# Patient Record
Sex: Male | Born: 1955
Health system: Southern US, Community
[De-identification: ages and names within clinical notes are randomized; demographics above are authoritative.]

## PROBLEM LIST (undated history)

## (undated) DIAGNOSIS — I10 Essential (primary) hypertension: Secondary | ICD-10-CM

## (undated) DIAGNOSIS — E119 Type 2 diabetes mellitus without complications: Secondary | ICD-10-CM

---

## 1998-04-07 ENCOUNTER — Emergency Department (HOSPITAL_COMMUNITY): Admission: EM | Admit: 1998-04-07 | Discharge: 1998-04-08 | Payer: Self-pay | Admitting: Endocrinology

## 2002-08-14 ENCOUNTER — Encounter: Payer: Self-pay | Admitting: Family Medicine

## 2002-08-14 ENCOUNTER — Ambulatory Visit (HOSPITAL_COMMUNITY): Admission: RE | Admit: 2002-08-14 | Discharge: 2002-08-14 | Payer: Self-pay | Admitting: Family Medicine

## 2013-12-23 ENCOUNTER — Other Ambulatory Visit: Payer: Self-pay | Admitting: Nurse Practitioner

## 2013-12-23 DIAGNOSIS — C220 Liver cell carcinoma: Secondary | ICD-10-CM

## 2014-01-19 ENCOUNTER — Ambulatory Visit
Admission: RE | Admit: 2014-01-19 | Discharge: 2014-01-19 | Disposition: A | Discharge status: Home / Self Care | Payer: 59 | Source: Ambulatory Visit | Attending: Nurse Practitioner | Admitting: Nurse Practitioner

## 2014-01-19 DIAGNOSIS — C220 Liver cell carcinoma: Secondary | ICD-10-CM

## 2014-09-14 ENCOUNTER — Other Ambulatory Visit: Payer: Self-pay | Admitting: Internal Medicine

## 2014-09-14 DIAGNOSIS — K7469 Other cirrhosis of liver: Secondary | ICD-10-CM

## 2014-09-24 ENCOUNTER — Ambulatory Visit
Admission: RE | Admit: 2014-09-24 | Discharge: 2014-09-24 | Disposition: A | Discharge status: Home / Self Care | Payer: 59 | Source: Ambulatory Visit | Attending: Internal Medicine | Admitting: Internal Medicine

## 2014-09-24 DIAGNOSIS — K7469 Other cirrhosis of liver: Secondary | ICD-10-CM

## 2015-01-25 ENCOUNTER — Other Ambulatory Visit: Payer: Self-pay | Admitting: Nurse Practitioner

## 2015-01-25 DIAGNOSIS — C220 Liver cell carcinoma: Secondary | ICD-10-CM

## 2015-07-18 ENCOUNTER — Other Ambulatory Visit: Payer: Self-pay | Admitting: Nurse Practitioner

## 2015-07-18 DIAGNOSIS — C220 Liver cell carcinoma: Secondary | ICD-10-CM

## 2016-04-30 ENCOUNTER — Ambulatory Visit
Admission: RE | Admit: 2016-04-30 | Discharge: 2016-04-30 | Disposition: A | Discharge status: Home / Self Care | Payer: BLUE CROSS/BLUE SHIELD | Source: Ambulatory Visit | Attending: Family Medicine | Admitting: Family Medicine

## 2016-04-30 ENCOUNTER — Other Ambulatory Visit: Payer: Self-pay | Admitting: Family Medicine

## 2016-04-30 DIAGNOSIS — R509 Fever, unspecified: Secondary | ICD-10-CM

## 2016-07-05 DIAGNOSIS — Z23 Encounter for immunization: Secondary | ICD-10-CM | POA: Diagnosis not present

## 2016-09-04 DIAGNOSIS — I10 Essential (primary) hypertension: Secondary | ICD-10-CM | POA: Diagnosis not present

## 2016-09-04 DIAGNOSIS — E119 Type 2 diabetes mellitus without complications: Secondary | ICD-10-CM | POA: Diagnosis not present

## 2016-09-04 DIAGNOSIS — F419 Anxiety disorder, unspecified: Secondary | ICD-10-CM | POA: Diagnosis not present

## 2016-09-04 DIAGNOSIS — N529 Male erectile dysfunction, unspecified: Secondary | ICD-10-CM | POA: Diagnosis not present

## 2016-10-04 ENCOUNTER — Encounter (HOSPITAL_COMMUNITY): Payer: Self-pay | Admitting: Emergency Medicine

## 2016-10-04 ENCOUNTER — Emergency Department (HOSPITAL_COMMUNITY): Payer: BLUE CROSS/BLUE SHIELD

## 2016-10-04 ENCOUNTER — Emergency Department (HOSPITAL_COMMUNITY)
Admission: EM | Admit: 2016-10-04 | Discharge: 2016-10-04 | Disposition: A | Discharge status: Home / Self Care | Payer: BLUE CROSS/BLUE SHIELD | Attending: Emergency Medicine | Admitting: Emergency Medicine

## 2016-10-04 DIAGNOSIS — Z7984 Long term (current) use of oral hypoglycemic drugs: Secondary | ICD-10-CM | POA: Diagnosis not present

## 2016-10-04 DIAGNOSIS — Y999 Unspecified external cause status: Secondary | ICD-10-CM | POA: Insufficient documentation

## 2016-10-04 DIAGNOSIS — Y92410 Unspecified street and highway as the place of occurrence of the external cause: Secondary | ICD-10-CM | POA: Diagnosis not present

## 2016-10-04 DIAGNOSIS — Y939 Activity, unspecified: Secondary | ICD-10-CM | POA: Insufficient documentation

## 2016-10-04 DIAGNOSIS — E119 Type 2 diabetes mellitus without complications: Secondary | ICD-10-CM | POA: Diagnosis not present

## 2016-10-04 DIAGNOSIS — I10 Essential (primary) hypertension: Secondary | ICD-10-CM | POA: Insufficient documentation

## 2016-10-04 DIAGNOSIS — S0990XA Unspecified injury of head, initial encounter: Secondary | ICD-10-CM | POA: Diagnosis not present

## 2016-10-04 DIAGNOSIS — S161XXA Strain of muscle, fascia and tendon at neck level, initial encounter: Secondary | ICD-10-CM | POA: Insufficient documentation

## 2016-10-04 DIAGNOSIS — Z7982 Long term (current) use of aspirin: Secondary | ICD-10-CM | POA: Insufficient documentation

## 2016-10-04 DIAGNOSIS — R51 Headache: Secondary | ICD-10-CM | POA: Diagnosis not present

## 2016-10-04 DIAGNOSIS — S199XXA Unspecified injury of neck, initial encounter: Secondary | ICD-10-CM | POA: Diagnosis not present

## 2016-10-04 DIAGNOSIS — M542 Cervicalgia: Secondary | ICD-10-CM | POA: Diagnosis not present

## 2016-10-04 HISTORY — DX: Essential (primary) hypertension: I10

## 2016-10-04 HISTORY — DX: Type 2 diabetes mellitus without complications: E11.9

## 2016-10-04 MED ORDER — HYDROCODONE-ACETAMINOPHEN 5-325 MG PO TABS: 1.0000 | ORAL_TABLET | Freq: Four times a day (QID) | ORAL | 0 refills | Status: AC | PRN

## 2016-10-04 MED ORDER — HYDROCODONE-ACETAMINOPHEN 5-325 MG PO TABS
1.0000 | ORAL_TABLET | Freq: Once | ORAL | Status: AC
  Administered 2016-10-04: 1 via ORAL
  Filled 2016-10-04: qty 1
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
## 2017-02-14 DIAGNOSIS — L400 Psoriasis vulgaris: Secondary | ICD-10-CM | POA: Diagnosis not present

## 2017-02-14 DIAGNOSIS — D239 Other benign neoplasm of skin, unspecified: Secondary | ICD-10-CM | POA: Diagnosis not present

## 2017-07-26 DIAGNOSIS — R55 Syncope and collapse: Secondary | ICD-10-CM | POA: Diagnosis not present

## 2017-07-26 DIAGNOSIS — R404 Transient alteration of awareness: Secondary | ICD-10-CM | POA: Diagnosis not present

## 2017-07-27 DIAGNOSIS — R404 Transient alteration of awareness: Secondary | ICD-10-CM | POA: Diagnosis not present

## 2017-07-27 DIAGNOSIS — R55 Syncope and collapse: Secondary | ICD-10-CM | POA: Diagnosis not present

## 2017-07-30 DIAGNOSIS — Z125 Encounter for screening for malignant neoplasm of prostate: Secondary | ICD-10-CM | POA: Diagnosis not present

## 2017-07-30 DIAGNOSIS — E119 Type 2 diabetes mellitus without complications: Secondary | ICD-10-CM | POA: Diagnosis not present

## 2017-07-30 DIAGNOSIS — E7800 Pure hypercholesterolemia, unspecified: Secondary | ICD-10-CM | POA: Diagnosis not present

## 2017-07-30 DIAGNOSIS — Z23 Encounter for immunization: Secondary | ICD-10-CM | POA: Diagnosis not present

## 2017-07-30 DIAGNOSIS — I10 Essential (primary) hypertension: Secondary | ICD-10-CM | POA: Diagnosis not present

## 2017-07-30 DIAGNOSIS — Z0000 Encounter for general adult medical examination without abnormal findings: Secondary | ICD-10-CM | POA: Diagnosis not present

## 2017-07-30 DIAGNOSIS — K219 Gastro-esophageal reflux disease without esophagitis: Secondary | ICD-10-CM | POA: Diagnosis not present

## 2018-02-05 DIAGNOSIS — I10 Essential (primary) hypertension: Secondary | ICD-10-CM | POA: Diagnosis not present

## 2018-02-05 DIAGNOSIS — E119 Type 2 diabetes mellitus without complications: Secondary | ICD-10-CM | POA: Diagnosis not present

## 2018-02-05 DIAGNOSIS — E7800 Pure hypercholesterolemia, unspecified: Secondary | ICD-10-CM | POA: Diagnosis not present

## 2018-02-05 DIAGNOSIS — K219 Gastro-esophageal reflux disease without esophagitis: Secondary | ICD-10-CM | POA: Diagnosis not present

## 2018-08-06 DIAGNOSIS — Z125 Encounter for screening for malignant neoplasm of prostate: Secondary | ICD-10-CM | POA: Diagnosis not present

## 2018-08-06 DIAGNOSIS — Z0000 Encounter for general adult medical examination without abnormal findings: Secondary | ICD-10-CM | POA: Diagnosis not present

## 2018-08-06 DIAGNOSIS — I10 Essential (primary) hypertension: Secondary | ICD-10-CM | POA: Diagnosis not present

## 2018-08-06 DIAGNOSIS — Z23 Encounter for immunization: Secondary | ICD-10-CM | POA: Diagnosis not present

## 2018-08-06 DIAGNOSIS — E1169 Type 2 diabetes mellitus with other specified complication: Secondary | ICD-10-CM | POA: Diagnosis not present

## 2018-08-06 DIAGNOSIS — K219 Gastro-esophageal reflux disease without esophagitis: Secondary | ICD-10-CM | POA: Diagnosis not present

## 2018-08-06 DIAGNOSIS — E7800 Pure hypercholesterolemia, unspecified: Secondary | ICD-10-CM | POA: Diagnosis not present

## 2019-02-05 DIAGNOSIS — K219 Gastro-esophageal reflux disease without esophagitis: Secondary | ICD-10-CM | POA: Diagnosis not present

## 2019-02-05 DIAGNOSIS — E7800 Pure hypercholesterolemia, unspecified: Secondary | ICD-10-CM | POA: Diagnosis not present

## 2019-02-05 DIAGNOSIS — I10 Essential (primary) hypertension: Secondary | ICD-10-CM | POA: Diagnosis not present

## 2019-05-15 ENCOUNTER — Encounter: Payer: Self-pay | Admitting: Podiatry

## 2019-05-15 ENCOUNTER — Ambulatory Visit: Payer: BC Managed Care – PPO | Admitting: Podiatry

## 2019-05-15 ENCOUNTER — Other Ambulatory Visit: Payer: Self-pay

## 2019-05-15 VITALS — BP 143/82 | HR 96 | Temp 97.7°F

## 2019-05-15 DIAGNOSIS — E119 Type 2 diabetes mellitus without complications: Secondary | ICD-10-CM | POA: Diagnosis not present

## 2019-05-15 DIAGNOSIS — M79675 Pain in left toe(s): Secondary | ICD-10-CM | POA: Diagnosis not present

## 2019-05-15 DIAGNOSIS — M2012 Hallux valgus (acquired), left foot: Secondary | ICD-10-CM

## 2019-05-15 DIAGNOSIS — M2011 Hallux valgus (acquired), right foot: Secondary | ICD-10-CM

## 2019-05-15 DIAGNOSIS — B353 Tinea pedis: Secondary | ICD-10-CM | POA: Diagnosis not present

## 2019-05-15 DIAGNOSIS — M79674 Pain in right toe(s): Secondary | ICD-10-CM

## 2019-05-15 DIAGNOSIS — B351 Tinea unguium: Secondary | ICD-10-CM

## 2019-05-15 DIAGNOSIS — L84 Corns and callosities: Secondary | ICD-10-CM

## 2019-05-15 MED ORDER — CLOTRIMAZOLE-BETAMETHASONE 1-0.05 % EX CREA: TOPICAL_CREAM | CUTANEOUS | 1 refills | Status: AC

## 2019-05-15 MED ORDER — CLOTRIMAZOLE-BETAMETHASONE 1-0.05 % EX CREA: TOPICAL_CREAM | CUTANEOUS | 0 refills | Status: DC

## 2019-05-15 NOTE — Patient Instructions (Signed)
Athlete's Foot  Athlete's foot (tinea pedis) is a fungal infection of the skin on your feet. It often occurs on the skin that is between or underneath your toes. It can also occur on the soles of your feet. Symptoms include itchy or white and flaky areas on the skin. The infection can spread from person to person (is contagious). It can also spread when a person's bare feet come in contact with the fungus on shower floors or on items such as shoes. Follow these instructions at home: Medicines  Apply or take over-the-counter and prescription medicines only as told by your doctor.  Apply your antifungal medicine as told by your doctor. Do not stop using the medicine even if your feet start to get better. Foot care  Do not scratch your feet.  Keep your feet dry: ? Wear cotton or wool socks. Change your socks every day or if they become wet. ? Wear shoes that allow air to move around, such as sandals or canvas tennis shoes.  Wash and dry your feet: ? Every day or as told by your doctor. ? After exercising. ? Including the area between your toes. General instructions  Do not share any of these items that touch your feet: ? Towels. ? Shoes. ? Nail clippers. ? Other personal items.  Protect your feet by wearing sandals in wet areas, such as locker rooms and shared showers.  Keep all follow-up visits as told by your doctor. This is important.  If you have diabetes, keep your blood sugar under control. Contact a doctor if:  You have a fever.  You have swelling, pain, warmth, or redness in your foot.  Your feet are not getting better with treatment.  Your symptoms get worse.  You have new symptoms. Summary  Athlete's foot is a fungal infection of the skin on your feet.  Symptoms include itchy or white and flaky areas on the skin.  Apply your antifungal medicine as told by your doctor.  Keep your feet clean and dry. This information is not intended to replace advice given  to you by your health care provider. Make sure you discuss any questions you have with your health care provider. Document Released: 03/19/2008 Document Revised: 09/26/2017 Document Reviewed: 07/22/2017 Elsevier Patient Education  2020 [employer]  

## 2019-05-15 NOTE — Progress Notes (Signed)
Subjective: Damon Patterson presents today referred by Maury Dus, MD for diabetic foot evaluation.  Patient relates greater than 10 year history of diabetes.  Patient denies any history of foot wounds.  Patient denies any history of numbness, tingling, burning, pins/needles sensations.  Today, patient c/o of painful, discolored, thick toenails which interfere with daily activities.  Pain is aggravated when wearing enclosed shoe gear.   He is also concerned about hard areas of skin on both feet.  He walks on his treadmill 5-6 miles/day Monday thru Friday. He is wearing Chartered loss adjuster.  Past Medical History:  Diagnosis Date  . Diabetes mellitus without complication ([place])   . Hypertension     There are no active problems to display for this patient.   History reviewed. No pertinent surgical history.   Current Outpatient Medications:  .  amLODipine (NORVASC) 5 MG tablet, Take 5 mg by mouth daily., Disp: , Rfl: 0 .  aspirin EC 81 MG tablet, Take 81 mg by mouth daily., Disp: , Rfl:  .  cetirizine (ZYRTEC) 10 MG tablet, Take 10 mg by mouth daily., Disp: , Rfl:  .  citalopram (CELEXA) 20 MG tablet, Take 20 mg by mouth daily., Disp: , Rfl: 0 .  cyclobenzaprine (FLEXERIL) 10 MG tablet, Take 10 mg by mouth at bedtime as needed for muscle spasms., Disp: , Rfl: 2 .  docusate sodium (COLACE) 100 MG capsule, Take 100 mg by mouth daily as needed for mild constipation., Disp: , Rfl:  .  HYDROcodone-acetaminophen (NORCO/VICODIN) 5-325 MG tablet, Take 1-2 tablets by mouth every 6 (six) hours as needed for moderate pain., Disp: 14 tablet, Rfl: 0 .  hydrocortisone (ANUSOL-HC) 2.5 % rectal cream, Place 1 application rectally daily., Disp: , Rfl:  .  losartan-hydrochlorothiazide (HYZAAR) 100-25 MG tablet, Take 1 tablet by mouth daily., Disp: , Rfl: 1 .  metFORMIN (GLUCOPHAGE) 500 MG tablet, Take 500 mg by mouth 2 (two) times daily with a meal., Disp: , Rfl: 0 .  Multiple Vitamins-Minerals  (MULTIVITAMIN WITH MINERALS) tablet, Take 1 tablet by mouth daily., Disp: , Rfl:  .  omeprazole (PRILOSEC) 20 MG capsule, Take 20 mg by mouth daily., Disp: , Rfl: 0 .  pimecrolimus (ELIDEL) 1 % cream, Apply 1 application topically daily. After shower for eczema, Disp: , Rfl:  .  sildenafil (REVATIO) 20 MG tablet, Take 20 mg by mouth daily as needed for erectile dysfunction., Disp: , Rfl: 1 .  valACYclovir (VALTREX) 1000 MG tablet, Take 1,000 mg by mouth 3 (three) times a week., Disp: , Rfl: 1 .  clotrimazole-betamethasone (LOTRISONE) cream, Apply to both feet and between toes bid x 4 weeks., Disp: 45 g, Rfl: 1  No Known Allergies  Social History   Occupational History  . Not on file  Tobacco Use  . Smoking status: Never Smoker  . Smokeless tobacco: Never Used  Substance and Sexual Activity  . Alcohol use: No  . Drug use: Not on file  . Sexual activity: Not on file    History reviewed. No pertinent family history.   There is no immunization history on file for this patient.  Review of systems: Positive Findings in bold print.  Constitutional:  chills, fatigue, fever, sweats, weight change Communication: Optometrist, sign Ecologist, hand writing, iPad/Android device Head: headaches, head injury Eyes: changes in vision, eye pain, glaucoma, cataracts, macular degeneration, diplopia, glare,  light sensitivity, eyeglasses or contacts, blindness Ears nose mouth throat: hearing impaired, hearing aids,  ringing in ears, deaf, sign  language,  vertigo, nosebleeds,  rhinitis,  cold sores, snoring, swollen glands Cardiovascular: HTN, edema, arrhythmia, pacemaker in place, defibrillator in place, chest pain/tightness, chronic anticoagulation, blood clot, heart failure, MI Peripheral Vascular: leg cramps, varicose veins, blood clots, lymphedema, varicosities Respiratory:  difficulty breathing, denies congestion, SOB, wheezing, cough, emphysema Gastrointestinal: change in appetite or  weight, abdominal pain, constipation, diarrhea, nausea, vomiting, vomiting blood, change in bowel habits, abdominal pain, jaundice, rectal bleeding, hemorrhoids, GERD Genitourinary:  nocturia,  pain on urination, polyuria,  blood in urine, Foley catheter, urinary urgency, ESRD on hemodialysis Musculoskeletal: amputation, cramping, stiff joints, painful joints, decreased joint motion, fractures, OA, gout, hemiplegia, paraplegia, uses cane, wheelchair bound, uses walker, uses rollator Skin: +changes in toenails, color change, dryness, itching, mole changes,  rash, wound(s) Neurological: headaches, numbness in feet, paresthesias in feet, burning in feet, fainting,  seizures, change in speech,  headaches, memory problems/poor historian, cerebral palsy, weakness, paralysis, CVA, TIA Endocrine: diabetes, hypothyroidism, hyperthyroidism,  goiter, dry mouth, flushing, heat intolerance,  cold intolerance,  excessive thirst, denies polyuria,  nocturia Hematological:  easy bleeding, excessive bleeding, easy bruising, enlarged lymph nodes, on long term blood thinner, history of past transusions Allergy/immunological:  hives, eczema, frequent infections, multiple drug allergies, seasonal allergies, transplant recipient, multiple food allergies Psychiatric:  anxiety, depression, mood disorder, suicidal ideations, hallucinations, insomnia  Objective: Vitals:   05/15/19 1554  BP: (!) 143/82  Pulse: 96  Temp: 97.7 F (36.5 C)   Vascular Examination: Capillary refill time immediate x 10 digits  Dorsalis pedis pulses palpable b/l.  Posterior tibial pulses palpable b/l.  Digital hair present x 10 digits.  Skin temperature gradient WNL b/l.  Dermatological Examination: Skin with normal turgor, texture and tone b/l.  Toenails 1-5 b/l discolored, thick, dystrophic with subungual debris and pain with palpation to nailbeds due to thickness of nails.  Hyperkeratotic lesion b/l 1st metatarsal heads with  tenderness to palpation. No edema, no erythema, no drainage, no flocculence.  Mild diffuse scaling noted peripherally and plantarly b/l feet with mild foot odor.  No interdigital macerations.  No blisters, no weeping. No signs of secondary bacterial infection noted.  Musculoskeletal: Muscle strength 5/5 to all LE muscle groups.  HAV with bunion deformity b/l.  Neurological: Sensation intact with 10 gram monofilament.  Vibratory sensation intact.  Assessment: 1. Painful onychomycosis toenails 1-5 b/l  2. Calluses 1st metatarsal head b/l 3. NIDDM  Plan: 1. Discussed diabetic foot care principles. Literature dispensed on today. 2. Toenails 1-5 b/l were debrided in length and girth without iatrogenic bleeding. Calluses pared 1st metatarsal heads b/l utilizing sterile scalpel blade without incident. Rx was sent for Lotrisone Cream to be applied to both feet and between toes bid for 4 weeks. Patient to continue soft, supportive shoe gear daily. He was advised he may need to change his sneakers in 6 months based on mileage he is putting on them with his walks. He related understanding. Patient to report any pedal injuries to medical professional immediately. Follow up 3 months.  Patient/POA to call should there be a concern in the interim.

## 2019-05-18 DIAGNOSIS — E119 Type 2 diabetes mellitus without complications: Secondary | ICD-10-CM | POA: Diagnosis not present

## 2019-06-11 ENCOUNTER — Other Ambulatory Visit: Payer: Self-pay

## 2019-06-11 DIAGNOSIS — R6889 Other general symptoms and signs: Secondary | ICD-10-CM | POA: Diagnosis not present

## 2019-06-11 DIAGNOSIS — Z20822 Contact with and (suspected) exposure to covid-19: Secondary | ICD-10-CM

## 2019-06-13 LAB — NOVEL CORONAVIRUS, NAA: SARS-CoV-2, NAA: NOT DETECTED

## 2019-08-18 ENCOUNTER — Ambulatory Visit: Payer: BC Managed Care – PPO | Admitting: Podiatry

## 2019-08-26 DIAGNOSIS — Z0000 Encounter for general adult medical examination without abnormal findings: Secondary | ICD-10-CM | POA: Diagnosis not present

## 2019-08-26 DIAGNOSIS — E7800 Pure hypercholesterolemia, unspecified: Secondary | ICD-10-CM | POA: Diagnosis not present

## 2019-08-26 DIAGNOSIS — K219 Gastro-esophageal reflux disease without esophagitis: Secondary | ICD-10-CM | POA: Diagnosis not present

## 2019-08-26 DIAGNOSIS — I10 Essential (primary) hypertension: Secondary | ICD-10-CM | POA: Diagnosis not present

## 2019-08-26 DIAGNOSIS — E1169 Type 2 diabetes mellitus with other specified complication: Secondary | ICD-10-CM | POA: Diagnosis not present

## 2019-09-02 DIAGNOSIS — I10 Essential (primary) hypertension: Secondary | ICD-10-CM | POA: Diagnosis not present

## 2019-09-02 DIAGNOSIS — E7800 Pure hypercholesterolemia, unspecified: Secondary | ICD-10-CM | POA: Diagnosis not present

## 2019-09-02 DIAGNOSIS — Z6832 Body mass index (BMI) 32.0-32.9, adult: Secondary | ICD-10-CM | POA: Diagnosis not present

## 2019-09-02 DIAGNOSIS — Z125 Encounter for screening for malignant neoplasm of prostate: Secondary | ICD-10-CM | POA: Diagnosis not present

## 2019-09-02 DIAGNOSIS — E1169 Type 2 diabetes mellitus with other specified complication: Secondary | ICD-10-CM | POA: Diagnosis not present

## 2019-11-24 ENCOUNTER — Other Ambulatory Visit: Payer: Self-pay | Admitting: Podiatry

## 2020-01-20 DIAGNOSIS — E1169 Type 2 diabetes mellitus with other specified complication: Secondary | ICD-10-CM | POA: Diagnosis not present

## 2020-01-20 DIAGNOSIS — K219 Gastro-esophageal reflux disease without esophagitis: Secondary | ICD-10-CM | POA: Diagnosis not present

## 2020-01-20 DIAGNOSIS — I10 Essential (primary) hypertension: Secondary | ICD-10-CM | POA: Diagnosis not present

## 2020-01-20 DIAGNOSIS — E7800 Pure hypercholesterolemia, unspecified: Secondary | ICD-10-CM | POA: Diagnosis not present

## 2020-01-26 ENCOUNTER — Ambulatory Visit
Admission: RE | Admit: 2020-01-26 | Discharge: 2020-01-26 | Disposition: A | Discharge status: Home / Self Care | Payer: BC Managed Care – PPO | Source: Ambulatory Visit | Attending: Family Medicine | Admitting: Family Medicine

## 2020-01-26 ENCOUNTER — Other Ambulatory Visit: Payer: Self-pay

## 2020-01-26 ENCOUNTER — Other Ambulatory Visit: Payer: Self-pay | Admitting: Family Medicine

## 2020-01-26 DIAGNOSIS — M542 Cervicalgia: Secondary | ICD-10-CM

## 2020-09-06 DIAGNOSIS — I10 Essential (primary) hypertension: Secondary | ICD-10-CM | POA: Diagnosis not present

## 2020-09-06 DIAGNOSIS — Z0000 Encounter for general adult medical examination without abnormal findings: Secondary | ICD-10-CM | POA: Diagnosis not present

## 2020-09-06 DIAGNOSIS — Z1389 Encounter for screening for other disorder: Secondary | ICD-10-CM | POA: Diagnosis not present

## 2020-09-06 DIAGNOSIS — Z125 Encounter for screening for malignant neoplasm of prostate: Secondary | ICD-10-CM | POA: Diagnosis not present

## 2020-09-06 DIAGNOSIS — E1169 Type 2 diabetes mellitus with other specified complication: Secondary | ICD-10-CM | POA: Diagnosis not present

## 2020-09-06 DIAGNOSIS — E7800 Pure hypercholesterolemia, unspecified: Secondary | ICD-10-CM | POA: Diagnosis not present

## 2020-09-06 DIAGNOSIS — Z23 Encounter for immunization: Secondary | ICD-10-CM | POA: Diagnosis not present

## 2020-09-06 DIAGNOSIS — F419 Anxiety disorder, unspecified: Secondary | ICD-10-CM | POA: Diagnosis not present

## 2020-11-11 DIAGNOSIS — L400 Psoriasis vulgaris: Secondary | ICD-10-CM | POA: Diagnosis not present

## 2020-12-30 DIAGNOSIS — L400 Psoriasis vulgaris: Secondary | ICD-10-CM | POA: Diagnosis not present

## 2021-02-20 DIAGNOSIS — S0991XA Unspecified injury of ear, initial encounter: Secondary | ICD-10-CM | POA: Diagnosis not present

## 2021-03-06 DIAGNOSIS — K219 Gastro-esophageal reflux disease without esophagitis: Secondary | ICD-10-CM | POA: Diagnosis not present

## 2021-03-06 DIAGNOSIS — E1169 Type 2 diabetes mellitus with other specified complication: Secondary | ICD-10-CM | POA: Diagnosis not present

## 2021-03-06 DIAGNOSIS — E7800 Pure hypercholesterolemia, unspecified: Secondary | ICD-10-CM | POA: Diagnosis not present

## 2021-03-06 DIAGNOSIS — I10 Essential (primary) hypertension: Secondary | ICD-10-CM | POA: Diagnosis not present

## 2021-04-11 DIAGNOSIS — H2513 Age-related nuclear cataract, bilateral: Secondary | ICD-10-CM | POA: Diagnosis not present

## 2021-04-11 DIAGNOSIS — E119 Type 2 diabetes mellitus without complications: Secondary | ICD-10-CM | POA: Diagnosis not present

## 2021-04-11 DIAGNOSIS — H5203 Hypermetropia, bilateral: Secondary | ICD-10-CM | POA: Diagnosis not present

## 2021-04-11 DIAGNOSIS — H524 Presbyopia: Secondary | ICD-10-CM | POA: Diagnosis not present

## 2021-09-19 DIAGNOSIS — Z1389 Encounter for screening for other disorder: Secondary | ICD-10-CM | POA: Diagnosis not present

## 2021-09-19 DIAGNOSIS — E7800 Pure hypercholesterolemia, unspecified: Secondary | ICD-10-CM | POA: Diagnosis not present

## 2021-09-19 DIAGNOSIS — K219 Gastro-esophageal reflux disease without esophagitis: Secondary | ICD-10-CM | POA: Diagnosis not present

## 2021-09-19 DIAGNOSIS — E1169 Type 2 diabetes mellitus with other specified complication: Secondary | ICD-10-CM | POA: Diagnosis not present

## 2021-09-19 DIAGNOSIS — I10 Essential (primary) hypertension: Secondary | ICD-10-CM | POA: Diagnosis not present

## 2021-09-19 DIAGNOSIS — Z125 Encounter for screening for malignant neoplasm of prostate: Secondary | ICD-10-CM | POA: Diagnosis not present

## 2021-09-19 DIAGNOSIS — Z0000 Encounter for general adult medical examination without abnormal findings: Secondary | ICD-10-CM | POA: Diagnosis not present

## 2021-10-16 DIAGNOSIS — J069 Acute upper respiratory infection, unspecified: Secondary | ICD-10-CM | POA: Diagnosis not present

## 2021-10-16 DIAGNOSIS — R059 Cough, unspecified: Secondary | ICD-10-CM | POA: Diagnosis not present

## 2021-10-16 DIAGNOSIS — J029 Acute pharyngitis, unspecified: Secondary | ICD-10-CM | POA: Diagnosis not present

## 2022-01-30 ENCOUNTER — Other Ambulatory Visit: Payer: Self-pay | Admitting: Physician Assistant

## 2022-01-30 DIAGNOSIS — M542 Cervicalgia: Secondary | ICD-10-CM | POA: Diagnosis not present

## 2022-01-30 DIAGNOSIS — G8929 Other chronic pain: Secondary | ICD-10-CM

## 2022-01-30 DIAGNOSIS — M5030 Other cervical disc degeneration, unspecified cervical region: Secondary | ICD-10-CM

## 2022-02-27 ENCOUNTER — Ambulatory Visit
Admission: RE | Admit: 2022-02-27 | Discharge: 2022-02-27 | Disposition: A | Discharge status: Home / Self Care | Payer: BC Managed Care – PPO | Source: Ambulatory Visit | Attending: Physician Assistant | Admitting: Physician Assistant

## 2022-02-27 DIAGNOSIS — M4802 Spinal stenosis, cervical region: Secondary | ICD-10-CM | POA: Diagnosis not present

## 2022-02-27 DIAGNOSIS — G8929 Other chronic pain: Secondary | ICD-10-CM

## 2022-02-27 DIAGNOSIS — M542 Cervicalgia: Secondary | ICD-10-CM | POA: Diagnosis not present

## 2022-02-27 DIAGNOSIS — M5030 Other cervical disc degeneration, unspecified cervical region: Secondary | ICD-10-CM

## 2022-03-26 DIAGNOSIS — K219 Gastro-esophageal reflux disease without esophagitis: Secondary | ICD-10-CM | POA: Diagnosis not present

## 2022-03-26 DIAGNOSIS — I10 Essential (primary) hypertension: Secondary | ICD-10-CM | POA: Diagnosis not present

## 2022-03-26 DIAGNOSIS — E1169 Type 2 diabetes mellitus with other specified complication: Secondary | ICD-10-CM | POA: Diagnosis not present

## 2022-03-26 DIAGNOSIS — E7800 Pure hypercholesterolemia, unspecified: Secondary | ICD-10-CM | POA: Diagnosis not present

## 2022-08-06 DIAGNOSIS — M545 Low back pain, unspecified: Secondary | ICD-10-CM | POA: Diagnosis not present

## 2022-08-15 DIAGNOSIS — M5416 Radiculopathy, lumbar region: Secondary | ICD-10-CM | POA: Diagnosis not present

## 2022-08-15 DIAGNOSIS — M25552 Pain in left hip: Secondary | ICD-10-CM | POA: Diagnosis not present

## 2022-08-15 DIAGNOSIS — M5412 Radiculopathy, cervical region: Secondary | ICD-10-CM | POA: Diagnosis not present

## 2022-08-22 DIAGNOSIS — M5116 Intervertebral disc disorders with radiculopathy, lumbar region: Secondary | ICD-10-CM | POA: Diagnosis not present

## 2022-09-28 DIAGNOSIS — M5116 Intervertebral disc disorders with radiculopathy, lumbar region: Secondary | ICD-10-CM | POA: Diagnosis not present

## 2022-10-02 DIAGNOSIS — M5116 Intervertebral disc disorders with radiculopathy, lumbar region: Secondary | ICD-10-CM | POA: Diagnosis not present

## 2022-10-22 DIAGNOSIS — Z23 Encounter for immunization: Secondary | ICD-10-CM | POA: Diagnosis not present

## 2022-10-22 DIAGNOSIS — I10 Essential (primary) hypertension: Secondary | ICD-10-CM | POA: Diagnosis not present

## 2022-10-22 DIAGNOSIS — Z125 Encounter for screening for malignant neoplasm of prostate: Secondary | ICD-10-CM | POA: Diagnosis not present

## 2022-10-22 DIAGNOSIS — E1169 Type 2 diabetes mellitus with other specified complication: Secondary | ICD-10-CM | POA: Diagnosis not present

## 2022-10-22 DIAGNOSIS — E7800 Pure hypercholesterolemia, unspecified: Secondary | ICD-10-CM | POA: Diagnosis not present

## 2022-10-22 DIAGNOSIS — Z0000 Encounter for general adult medical examination without abnormal findings: Secondary | ICD-10-CM | POA: Diagnosis not present

## 2022-10-26 DIAGNOSIS — M5116 Intervertebral disc disorders with radiculopathy, lumbar region: Secondary | ICD-10-CM | POA: Diagnosis not present

## 2022-10-31 IMAGING — MR MR CERVICAL SPINE W/O CM
4 of 5 series · 28 of 48 positions shown · non-contrast
Comparison: Cervical spine CT 10/04/2016. Cervical spine
radiographs 01/26/2020.

CLINICAL DATA: 66-year-old male with neck pain radiating to the
left shoulder and arm for 2-3 years. Left hand weakness.

EXAM:
MRI CERVICAL SPINE WITHOUT CONTRAST
TECHNIQUE: Multiplanar, multisequence MR imaging of the cervical spine was
performed. No intravenous contrast was administered.

[Series 5: T2 · sagittal · 3.0mm · 0.55mm/px · 8 of 17 slices shown (1 of 2)]
[im 1/17]
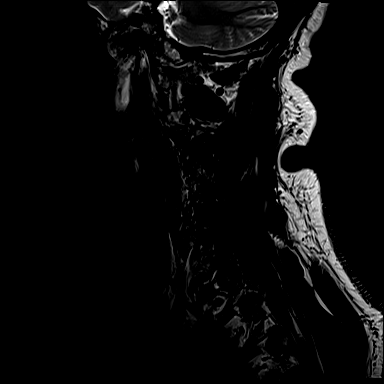
[im 3/17]
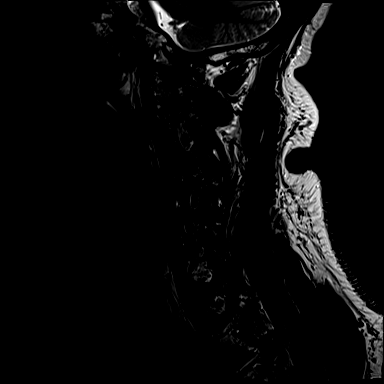
[im 5/17]
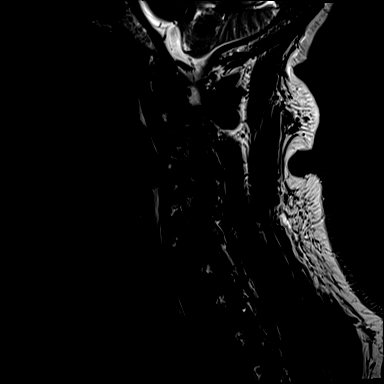
[im 7/17]
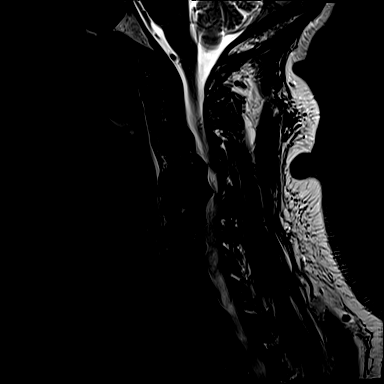
[im 10/17]
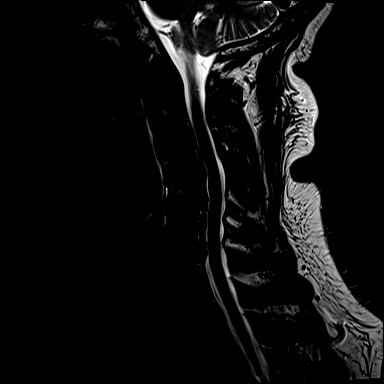
[im 12/17]
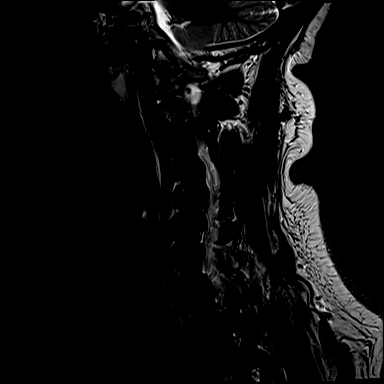
[im 14/17]
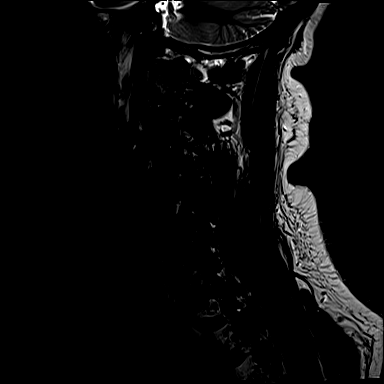
[im 17/17]
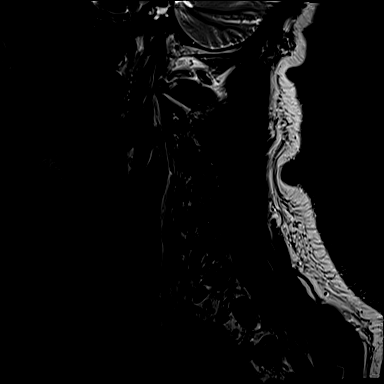

[Series 6: T1 · sagittal · 3.0mm · 0.66mm/px · 7 of 17 slices shown]
[im 1/17]
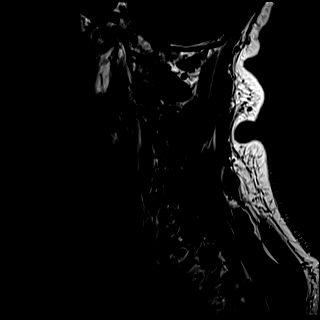
[im 3/17]
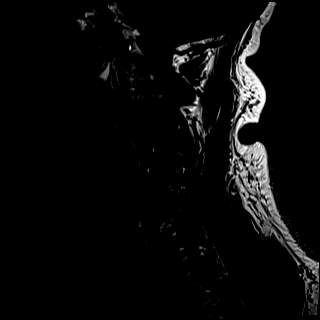
[im 6/17]
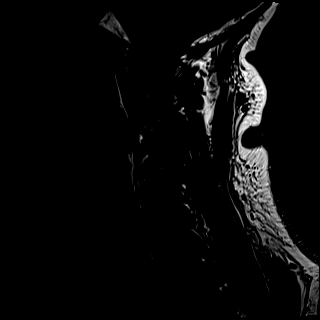
[im 9/17]
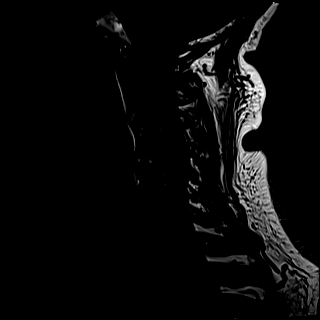
[im 11/17]
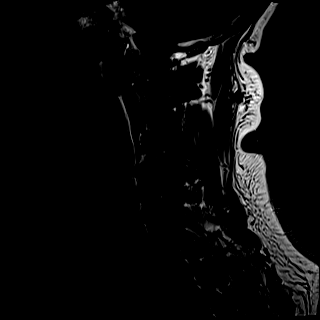
[im 14/17]
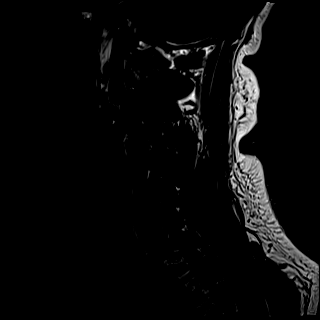
[im 17/17]
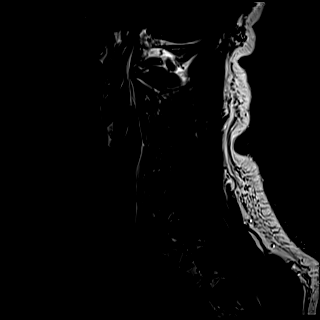

[Series 7: STIR · sagittal · 3.0mm · 0.33mm/px · 4 of 17 slices shown]
[im 1/17]
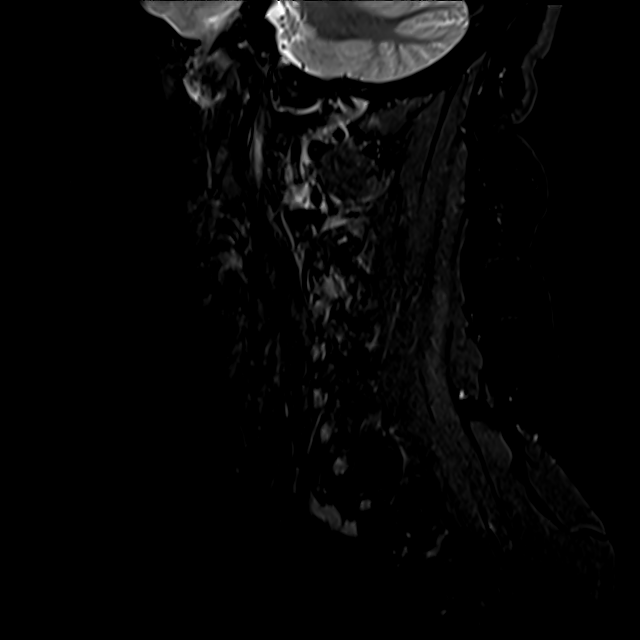
[im 3/17]
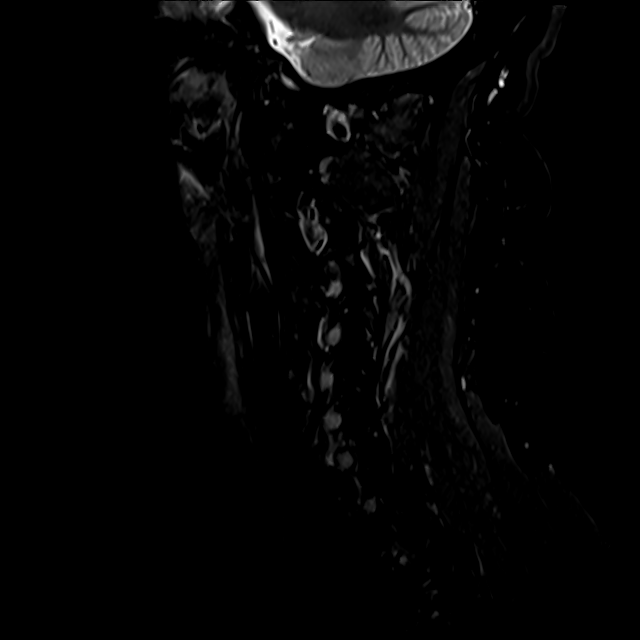
[im 9/17]
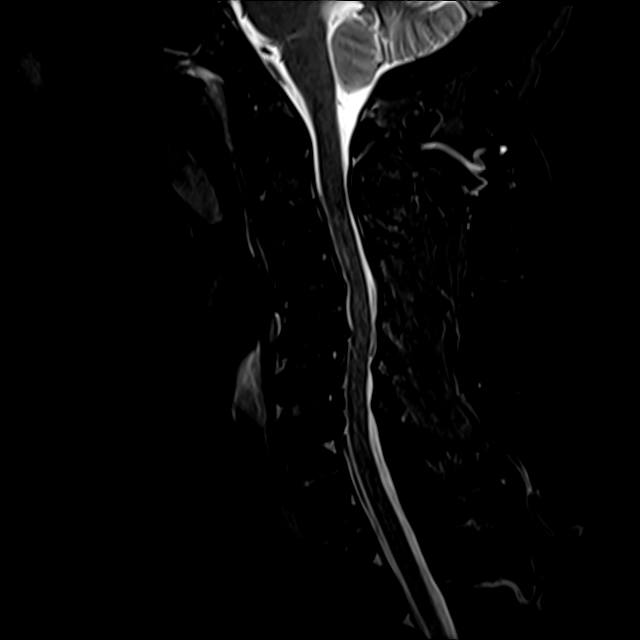
[im 14/17]
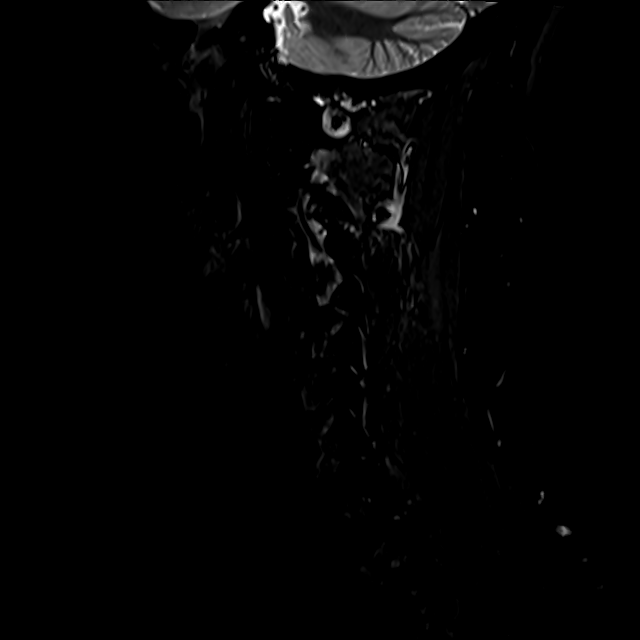

[Series 8: T2 · axial · 3.0mm · 0.50mm/px · z∈[-114,-19]mm · 9 of 30 slices shown (2 of 2)]
[im 1/30]
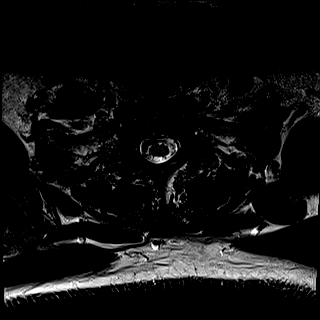
[im 5/30]
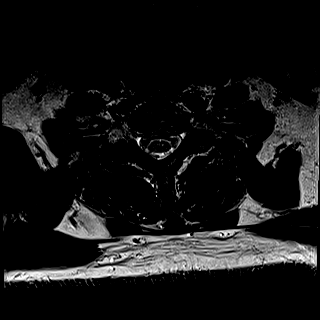
[im 10/30]
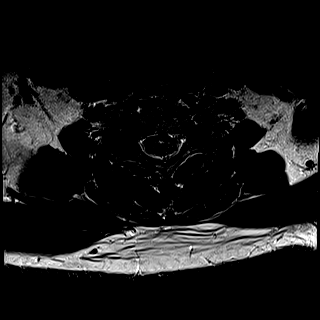
[im 13/30]
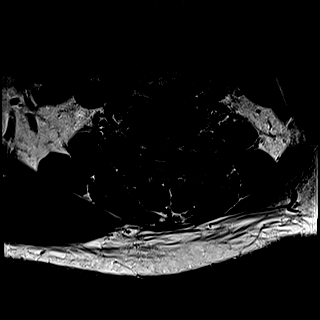
[im 15/30]
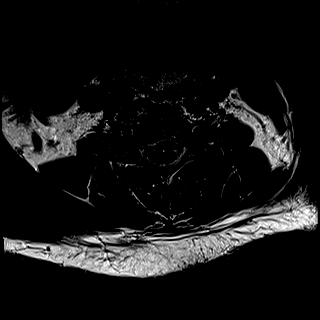
[im 17/30]
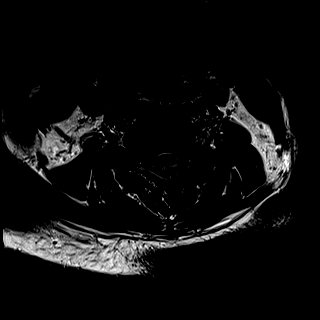
[im 20/30]
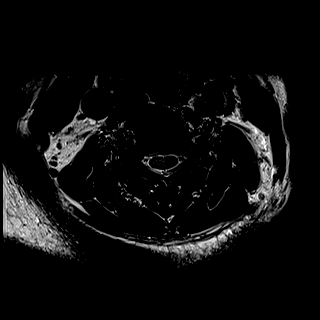
[im 25/30]
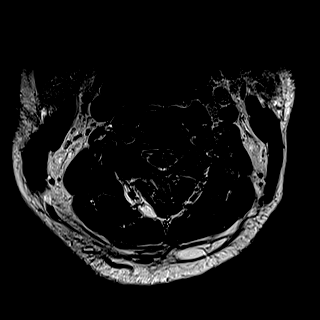
[im 30/30]
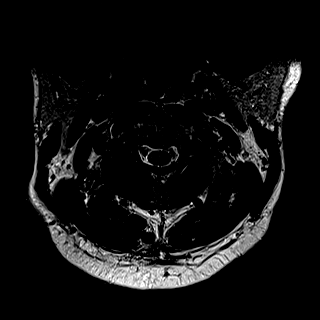

[28 of 48 positions shown; findings below may reference images not displayed]

FINDINGS: Alignment: Chronic reversal of cervical lordosis does not appear
significantly changed since 5869. Cervicothoracic junction alignment
is within normal limits.

Vertebrae: Chronic degenerative endplate marrow signal changes
throughout much of the cervical spine. No marrow edema or evidence
of acute osseous abnormality. Background bone marrow signal within
normal limits.

Cord: No spinal cord signal abnormality despite some degenerative
cord mass effect in the cervical spine detailed below. Negative
visible upper thoracic spinal canal and cord.

Posterior Fossa, vertebral arteries, paraspinal tissues:
Cervicomedullary junction is within normal limits. Negative visible
posterior fossa. Preserved major vascular flow voids in the neck.
Partially retropharyngeal course of the right carotid. Negative
visible neck soft tissues and lung apices.

Disc levels:

C2-C3: Moderate left side facet hypertrophy appears progressed since
5869 (series 9, image 2). Leftward disc bulging and endplate
spurring. No definite spinal stenosis but severe left C3 foraminal
stenosis has progressed since the prior CT.

C3-C4: Subtle anterolisthesis. Circumferential disc bulge and
endplate spurring. Mild to moderate facet hypertrophy on the right.
No spinal stenosis. Moderate left and severe right C4 foraminal
stenosis.

C4-C5: Disc space loss with circumferential disc osteophyte complex.
Bulky foraminal and far lateral components. Mild facet and ligament
flavum hypertrophy. Mild spinal stenosis. Mild if any cord mass
effect. Moderate to severe left and severe right C5 foraminal
stenosis.

C5-C6: Disc space loss with circumferential disc osteophyte complex
eccentric to the left. Mild facet and ligament flavum hypertrophy.
Mild spinal stenosis. Up to mild cord mass effect. Severe left and
moderate to severe right C6 foraminal stenosis.

C6-C7: Disc space loss. Circumferential disc osteophyte complex.
Broad-based posterior and foraminal involvement with mild facet and
ligament flavum hypertrophy. Mild spinal stenosis. Mild if any cord
mass effect. Moderate to severe bilateral C7 foraminal stenosis.

C7-T1: Negative disc. Mild facet and ligament flavum hypertrophy
greater on the right. No significant stenosis.
IMPRESSION: 1. Widespread cervical spine degeneration with no acute osseous
abnormality.
2. Multifactorial mild spinal stenosis C4-C5 through C6-C7. Up to
mild cord mass effect but no cord signal abnormality.
3. Associated moderate or severe neural foraminal stenosis at the
left C3 and the bilateral C4 through C7 nerve levels.

## 2022-11-01 DIAGNOSIS — M5116 Intervertebral disc disorders with radiculopathy, lumbar region: Secondary | ICD-10-CM | POA: Diagnosis not present

## 2022-11-30 DIAGNOSIS — M5116 Intervertebral disc disorders with radiculopathy, lumbar region: Secondary | ICD-10-CM | POA: Diagnosis not present

## 2022-12-14 DIAGNOSIS — M5116 Intervertebral disc disorders with radiculopathy, lumbar region: Secondary | ICD-10-CM | POA: Diagnosis not present

## 2022-12-14 DIAGNOSIS — R972 Elevated prostate specific antigen [PSA]: Secondary | ICD-10-CM | POA: Diagnosis not present

## 2022-12-24 DIAGNOSIS — Z23 Encounter for immunization: Secondary | ICD-10-CM | POA: Diagnosis not present

## 2022-12-28 DIAGNOSIS — M5416 Radiculopathy, lumbar region: Secondary | ICD-10-CM | POA: Diagnosis not present

## 2022-12-28 DIAGNOSIS — M5412 Radiculopathy, cervical region: Secondary | ICD-10-CM | POA: Diagnosis not present

## 2023-01-07 DIAGNOSIS — M545 Low back pain, unspecified: Secondary | ICD-10-CM | POA: Diagnosis not present

## 2023-01-23 DIAGNOSIS — M5416 Radiculopathy, lumbar region: Secondary | ICD-10-CM | POA: Diagnosis not present

## 2023-03-07 DIAGNOSIS — N401 Enlarged prostate with lower urinary tract symptoms: Secondary | ICD-10-CM | POA: Diagnosis not present

## 2023-03-07 DIAGNOSIS — R972 Elevated prostate specific antigen [PSA]: Secondary | ICD-10-CM | POA: Diagnosis not present

## 2023-03-07 DIAGNOSIS — R3912 Poor urinary stream: Secondary | ICD-10-CM | POA: Diagnosis not present

## 2023-03-08 DIAGNOSIS — M5412 Radiculopathy, cervical region: Secondary | ICD-10-CM | POA: Diagnosis not present

## 2023-03-08 DIAGNOSIS — M5416 Radiculopathy, lumbar region: Secondary | ICD-10-CM | POA: Diagnosis not present

## 2023-03-13 DIAGNOSIS — M5412 Radiculopathy, cervical region: Secondary | ICD-10-CM | POA: Diagnosis not present

## 2023-04-05 DIAGNOSIS — M5412 Radiculopathy, cervical region: Secondary | ICD-10-CM | POA: Diagnosis not present

## 2023-04-05 DIAGNOSIS — M5416 Radiculopathy, lumbar region: Secondary | ICD-10-CM | POA: Diagnosis not present

## 2023-05-03 DIAGNOSIS — E1169 Type 2 diabetes mellitus with other specified complication: Secondary | ICD-10-CM | POA: Diagnosis not present

## 2023-05-03 DIAGNOSIS — A6000 Herpesviral infection of urogenital system, unspecified: Secondary | ICD-10-CM | POA: Diagnosis not present

## 2023-05-03 DIAGNOSIS — E7800 Pure hypercholesterolemia, unspecified: Secondary | ICD-10-CM | POA: Diagnosis not present

## 2023-05-03 DIAGNOSIS — I10 Essential (primary) hypertension: Secondary | ICD-10-CM | POA: Diagnosis not present

## 2023-07-16 DIAGNOSIS — Z23 Encounter for immunization: Secondary | ICD-10-CM | POA: Diagnosis not present

## 2023-07-16 DIAGNOSIS — R1013 Epigastric pain: Secondary | ICD-10-CM | POA: Diagnosis not present

## 2023-10-30 ENCOUNTER — Other Ambulatory Visit: Payer: Self-pay | Admitting: Family Medicine

## 2023-10-30 DIAGNOSIS — Z136 Encounter for screening for cardiovascular disorders: Secondary | ICD-10-CM

## 2024-07-14 ENCOUNTER — Other Ambulatory Visit: Payer: Self-pay | Admitting: Nurse Practitioner

## 2024-07-14 DIAGNOSIS — N403 Nodular prostate with lower urinary tract symptoms: Secondary | ICD-10-CM

## 2024-07-14 DIAGNOSIS — R972 Elevated prostate specific antigen [PSA]: Secondary | ICD-10-CM

## 2024-10-23 ENCOUNTER — Ambulatory Visit
Admission: RE | Admit: 2024-10-23 | Discharge: 2024-10-23 | Disposition: A | Source: Ambulatory Visit | Attending: Nurse Practitioner

## 2024-10-23 DIAGNOSIS — R972 Elevated prostate specific antigen [PSA]: Secondary | ICD-10-CM

## 2024-10-23 DIAGNOSIS — N403 Nodular prostate with lower urinary tract symptoms: Secondary | ICD-10-CM

## 2024-10-23 MED ORDER — GADOPICLENOL 0.5 MMOL/ML IV SOLN
9.0000 mL | Freq: Once | INTRAVENOUS | Status: AC | PRN
  Administered 2024-10-23: 9 mL via INTRAVENOUS

## 2024-10-29 ENCOUNTER — Other Ambulatory Visit (HOSPITAL_COMMUNITY): Payer: Self-pay

## 2024-10-29 MED ORDER — LEVOFLOXACIN 750 MG PO TABS
750.0000 mg | ORAL_TABLET | ORAL | 0 refills | Status: AC
  Filled 2024-10-29: qty 1, 1d supply, fill #0

## 2024-11-08 ENCOUNTER — Other Ambulatory Visit (HOSPITAL_COMMUNITY): Payer: Self-pay
